# Patient Record
Sex: Male | Born: 1990 | Race: White | Hispanic: No | Marital: Married | Smoking: Current some day smoker
Health system: Southern US, Community
[De-identification: ages and names within clinical notes are randomized; demographics above are authoritative.]

---

## 2015-05-23 ENCOUNTER — Ambulatory Visit (INDEPENDENT_AMBULATORY_CARE_PROVIDER_SITE_OTHER): Payer: Managed Care, Other (non HMO) | Admitting: Family Medicine

## 2015-05-23 VITALS — BP 114/74 | HR 60 | Temp 98.7°F | Resp 14 | Ht 72.0 in | Wt 167.0 lb

## 2015-05-23 DIAGNOSIS — Z23 Encounter for immunization: Secondary | ICD-10-CM

## 2015-05-23 NOTE — Patient Instructions (Addendum)
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    Tdap Vaccine (Tetanus, Diphtheria, Pertussis): What You Need to Know 1. Why get vaccinated? Tetanus, diphtheria and pertussis can be very serious diseases, even for adolescents and adults. Tdap vaccine can protect [place] from these diseases. TETANUS (Lockjaw) causes painful muscle tightening and stiffness, usually all over the body.  It can lead to tightening of muscles in the head and neck so you can't open your mouth, swallow, or sometimes even breathe. Tetanus kills about 1 out of 5 people who are infected. DIPHTHERIA can cause a thick coating to form in the back of the throat.  It can lead to breathing problems, paralysis, heart failure, and death. PERTUSSIS (Whooping Cough) causes severe coughing spells, which can cause difficulty breathing, vomiting and disturbed sleep.  It can also lead to weight loss, incontinence, and rib fractures. Up to 2 in 100 adolescents and 5 in 100 adults with pertussis are hospitalized or have complications, which could include pneumonia or death. These diseases are caused by bacteria. Diphtheria and pertussis are spread from person to person through coughing or sneezing. Tetanus enters the body through cuts, scratches, or wounds. Before vaccines, the [place] saw as many as 200,000 cases a year of diphtheria and pertussis, and hundreds of cases of tetanus. Since vaccination began, tetanus and diphtheria have dropped by about 99% and pertussis by about 80%. 2. Tdap vaccine Tdap vaccine can protect adolescents and adults from tetanus, diphtheria, and pertussis. One dose of Tdap is routinely given at age 68 or 41. People who did not get Tdap at that age should get it as soon as possible. Tdap is especially important for health care professionals and anyone having close contact with a baby younger than 12 months. Pregnant women should get a dose of Tdap during every pregnancy, to protect  the newborn from pertussis. Infants are most at risk for severe, life-threatening complications from pertussis. A similar vaccine, called Td, protects from tetanus and diphtheria, but not pertussis. A Td booster should be given every 10 years. Tdap may be given as one of these boosters if you have not already gotten a dose. Tdap may also be given after a severe cut or burn to prevent tetanus infection. Your doctor can give you more information. Tdap may safely be given at the same time as other vaccines. 3. Some people should not get this vaccine  If you ever had a life-threatening allergic reaction after a dose of any tetanus, diphtheria, or pertussis containing vaccine, OR if you have a severe allergy to any part of this vaccine, you should not get Tdap. Tell your doctor if you have any severe allergies.  If you had a coma, or long or multiple seizures within 7 days after a childhood dose of DTP or DTaP, you should not get Tdap, unless a cause other than the vaccine was found. You can still get Td.  Talk to your doctor if you:  have epilepsy or another nervous system problem,  had severe pain or swelling after any vaccine containing diphtheria, tetanus or pertussis,  ever had Guillain-Barr Syndrome (GBS),  aren't feeling well on the day the shot is scheduled. 4. Risks of a vaccine reaction With any medicine, including vaccines, there is a chance of side effects. These are usually mild and go away on their own, but serious reactions are also possible. Brief fainting spells can follow a vaccination, leading to injuries from falling. Sitting or  lying down for about 15 minutes can help prevent these. Tell your doctor if you feel dizzy or light-headed, or have vision changes or ringing in the ears. Mild problems following Tdap (Did not interfere with activities)  Pain where the shot was given (about 3 in 4 adolescents or 2 in 3 adults)  Redness or swelling where the shot was given (about 1  person in 5)  Mild fever of at least 100.45F (up to about 1 in 25 adolescents or 1 in 100 adults)  Headache (about 3 or 4 people in 10)  Tiredness (about 1 person in 3 or 4)  Nausea, vomiting, diarrhea, stomach ache (up to 1 in 4 adolescents or 1 in 10 adults)  Chills, body aches, sore joints, rash, swollen glands (uncommon) Moderate problems following Tdap (Interfered with activities, but did not require medical attention)  Pain where the shot was given (about 1 in 5 adolescents or 1 in 100 adults)  Redness or swelling where the shot was given (up to about 1 in 16 adolescents or 1 in 25 adults)  Fever over 102F (about 1 in 100 adolescents or 1 in 250 adults)  Headache (about 3 in 20 adolescents or 1 in 10 adults)  Nausea, vomiting, diarrhea, stomach ache (up to 1 or 3 people in 100)  Swelling of the entire arm where the shot was given (up to about 3 in 100). Severe problems following Tdap (Unable to perform usual activities; required medical attention)  Swelling, severe pain, bleeding and redness in the arm where the shot was given (rare). A severe allergic reaction could occur after any vaccine (estimated less than 1 in a million doses). 5. What if there is a serious reaction? What should I look for?  Look for anything that concerns you, such as signs of a severe allergic reaction, very high fever, or behavior changes. Signs of a severe allergic reaction can include hives, swelling of the face and throat, difficulty breathing, a fast heartbeat, dizziness, and weakness. These would start a few minutes to a few hours after the vaccination. What should I do?  If you think it is a severe allergic reaction or other emergency that can't wait, call 9-1-1 or get the person to the nearest hospital. Otherwise, call your doctor.  Afterward, the reaction should be reported to the "Vaccine Adverse Event Reporting System" (VAERS). Your doctor might file this report, or you can do it  yourself through the VAERS web site at www.vaers.LAgents.no, or by calling 1-980 268 3133. VAERS is only for reporting reactions. They do not give medical advice.  6. The National Vaccine Injury Compensation Program The [employer] Vaccine Injury Compensation Program (VICP) is a federal program that was created to compensate people who may have been injured by certain vaccines. Persons who believe they may have been injured by a vaccine can learn about the program and about filing a claim by calling 1-720-206-4131 or visiting the VICP website at SpiritualWord.at. 7. How can I learn more?  Ask your doctor.  Call your local or state health department.  Contact the Centers for Disease Control and Prevention (CDC):  Call 832-680-3862 or visit CDC's website at PicCapture.uy. CDC Tdap Vaccine VIS (02/10/12) Document Released: 03/21/2012 Document Revised: 02/04/2014 Document Reviewed: 01/02/2014 ExitCare Patient Information 2015 [place], [place]. This information is not intended to replace advice given to you by your health care provider. Make sure you discuss any questions you have with your health care provider.

## 2015-05-23 NOTE — Progress Notes (Signed)
[facility] [address] [place] [address] [phone]  Date:  05/23/2015   Name:  Steve Huerta   DOB:  03-01-1991   MRN:  [id number]  PCP:  No primary care provider on file.    Chief Complaint: Immunizations   History of Present Illness:  Steve Huerta is a 24 y.o. very pleasant male patient who presents with the following:  He is a Ship broker at [employer].  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    He states he was given "1/2 doses" of immunizations as a child because he tended to have "strong reactions"- his mother called, and stated that he had leg swelling after his MMR vaccine. Throughout his childhood it seems that they had him on reduced doses of his shots.  He has not had an immunization in some years.  He never had an allergic reaction per se to an immunization He is otherwise healthy   There are no active problems to display for this patient.   No past medical history on file.  No past surgical history on file.  Social History  Substance Use Topics  . Smoking status: Never Smoker   . Smokeless tobacco: Not on file  . Alcohol Use: No    No family history on file.  Allergies no known allergies  Medication list has been reviewed and updated.  No current outpatient prescriptions on file prior to visit.   No current facility-administered medications on file prior to visit.    Review of Systems:  As per HPI- otherwise negative.   Physical Examination: Filed Vitals:   05/23/15 1410  BP: 114/74  Pulse: 60  Temp: 98.7 F (37.1 C)  Resp: 14   Filed Vitals:   05/23/15 1410  Height: 6' (1.829 m)  Weight: 167 lb (75.751 kg)   Body mass index is 22.64 kg/(m^2). Ideal Body Weight: Weight in (lb) to have BMI = 25: 183.9   GEN: WDWN, NAD, Non-toxic, Alert & Oriented x 3, looks well HEENT: Atraumatic, Normocephalic.  Ears and Nose: No external deformity. EXTR: No  clubbing/cyanosis/edema NEURO: Normal gait.  PSYCH: Normally interactive. Conversant. Not depressed or anxious appearing.  Calm demeanor.   Assessment and Plan: Immunization due - Plan: Tdap vaccine greater than or equal to 7yo IM  Given tdap today- after discussion Steve Huerta chose to have the full dose of tdap.   We did give it in the hip as he has an audition tomorrow and is concerned about arm soreness  Fax to [phone] which is [facility] immunization fax  Signed Lamar Blinks, MD

## 2016-06-16 ENCOUNTER — Encounter: Payer: Self-pay | Admitting: Urgent Care

## 2016-06-16 ENCOUNTER — Ambulatory Visit (INDEPENDENT_AMBULATORY_CARE_PROVIDER_SITE_OTHER): Payer: Managed Care, Other (non HMO) | Admitting: Urgent Care

## 2016-06-16 VITALS — BP 118/74 | HR 80 | Temp 98.6°F | Resp 17 | Ht 72.0 in | Wt 166.0 lb

## 2016-06-16 DIAGNOSIS — F909 Attention-deficit hyperactivity disorder, unspecified type: Secondary | ICD-10-CM | POA: Diagnosis not present

## 2016-06-16 DIAGNOSIS — F988 Other specified behavioral and emotional disorders with onset usually occurring in childhood and adolescence: Secondary | ICD-10-CM

## 2016-06-16 MED ORDER — AMPHETAMINE-DEXTROAMPHETAMINE 5 MG PO TABS: 5.0000 mg | ORAL_TABLET | Freq: Every day | ORAL | 0 refills | Status: DC

## 2016-06-16 NOTE — Patient Instructions (Addendum)
Please contact [facility] in 1 week if you have not yet heard from us.   Address: [address], [place], [place] [address]  Hours:  Open today  8AM-5PM  phone: [phone]    Amphetamine; Dextroamphetamine tablets What is this medicine? AMPHETAMINE; DEXTROAMPHETAMINE(am FET a meen; dex troe am FET a meen) is used to treat attention-deficit hyperactivity disorder (ADHD). It may also be used for narcolepsy. Federal law prohibits giving this medicine to any person other than the person for whom it was prescribed. Do not share this medicine with anyone else. This medicine may be used for other purposes; ask your health care provider or pharmacist if you have questions. What should I tell my health care provider before I take this medicine? They need to know if you have any of these conditions: -anxiety or panic attacks -circulation problems in fingers and toes -glaucoma -hardening or blockages of the arteries or heart blood vessels -heart disease or a heart defect -high blood pressure -history of a drug or alcohol abuse problem -history of stroke -kidney disease -liver disease -mental illness -seizures -suicidal thoughts, plans, or attempt; a previous suicide attempt by you or a family member -thyroid disease -Tourette's syndrome -an unusual or allergic reaction to dextroamphetamine, other amphetamines, other medicines, foods, dyes, or preservatives -pregnant or trying to get pregnant -breast-feeding How should I use this medicine? Take this medicine by mouth with a glass of water. Follow the directions on the prescription label. Take your doses at regular intervals. Do not take your medicine more often than directed. Do not suddenly stop your medicine. You must gradually reduce the dose or you may feel withdrawal effects. Ask your doctor or health care professional for advice. Talk to your pediatrician regarding the use of this medicine in children. Special care may  be needed. While this drug may be prescribed for children as young as 3 years for selected conditions, precautions do apply. Overdosage: If you think you have taken too much of this medicine contact a poison control center or emergency room at once. NOTE: This medicine is only for you. Do not share this medicine with others. What if I miss a dose? If you miss a dose, take it as soon as you can. If it is almost time for your next dose, take only that dose. Do not take double or extra doses. What may interact with this medicine? Do not take this medicine with any of the following medications: -MAOIS like Carbex, Eldepryl, Marplan, Nardil, and Parnate -other stimulant medicines for attention disorders, weight loss, or to stay awake This medicine may also interact with the following medications: -acetazolamide -ammonium chloride -antacids -ascorbic acid -atomoxetine -caffeine -certain medicines for blood pressure -certain medicines for depression, anxiety, or psychotic disturbances -certain medicines for seizures like carbamazepine, phenobarbital, phenytoin -certain medicines for stomach problems like cimetidine, famotidine, omeprazole, lansoprazole -cold or allergy medicines -glutamic acid -lithium -meperidine -methenamine; sodium acid phosphate -narcotic medicines for pain -norepinephrine -phenothiazines like chlorpromazine, mesoridazine, prochlorperazine, thioridazine -sodium acid phosphate -sodium bicarbonate This list may not describe all possible interactions. Give your health care provider a list of all the medicines, herbs, non-prescription drugs, or dietary supplements you use. Also tell them if you smoke, drink alcohol, or use illegal drugs. Some items may interact with your medicine. What should I watch for while using this medicine? Visit your doctor or health care professional for regular checks on your progress. This prescription requires that you follow special procedures  with your doctor  and pharmacy. You will need to have a new written prescription from your doctor every time you need a refill. This medicine may affect your concentration, or hide signs of tiredness. Until you know how this medicine affects you, do not drive, ride a bicycle, use machinery, or do anything that needs mental alertness. Tell your doctor or health care professional if this medicine loses its effects, or if you feel you need to take more than the prescribed amount. Do not change the dosage without talking to your doctor or health care professional. Decreased appetite is a common side effect when starting this medicine. Eating small, frequent meals or snacks can help. Talk to your doctor if you continue to have poor eating habits. Height and weight growth of a child taking this medicine will be monitored closely. Do not take this medicine close to bedtime. It may prevent you from sleeping. If you are going to need surgery, a MRI, CT scan, or other procedure, tell your doctor that you are taking this medicine. You may need to stop taking this medicine before the procedure. Tell your doctor or healthcare professional right away if you notice unexplained wounds on your fingers and toes while taking this medicine. You should also tell your healthcare provider if you experience numbness or pain, changes in the skin color, or sensitivity to temperature in your fingers or toes. What side effects may I notice from receiving this medicine? Side effects that you should report to your doctor or health care professional as soon as possible: -allergic reactions like skin rash, itching or hives, swelling of the face, lips, or tongue -changes in vision -chest pain or chest tightness -confusion, trouble speaking or understanding -fast, irregular heartbeat -fingers or toes feel numb, cool, painful -hallucination, loss of contact with reality -high blood pressure -males: prolonged or painful  erection -seizures -severe headaches -shortness of breath -suicidal thoughts or other mood changes -trouble walking, dizziness, loss of balance or coordination -uncontrollable head, mouth, neck, arm, or leg movements Side effects that usually do not require medical attention (report to your doctor or health care professional if they continue or are bothersome): -anxious -headache -loss of appetite -nausea, vomiting -trouble sleeping -weight loss This list may not describe all possible side effects. Call your doctor for medical advice about side effects. You may report side effects to FDA at 1-800-FDA-1088. Where should I keep my medicine? Keep out of the reach of children. This medicine can be abused. Keep your medicine in a safe place to protect it from theft. Do not share this medicine with anyone. Selling or giving away this medicine is dangerous and against the law. Store at room temperature between 15 and 30 degrees C (59 and 86 degrees F). Keep container tightly closed. Throw away any unused medicine after the expiration date. Dispose of properly. This medicine may cause accidental overdose and death if it is taken by other adults, children, or pets. Mix any unused medicine with a substance like cat litter or coffee grounds. Then throw the medicine away in a sealed container like a sealed bag or a coffee can with a lid. Do not use the medicine after the expiration date. NOTE: This sheet is a summary. It may not cover all possible information. If you have questions about this medicine, talk to your doctor, pharmacist, or health care provider.    2016, Elsevier/Gold Standard. (2014-07-24 18:44:41)     IF you received an x-ray today, you will receive an invoice from [facility].  Please contact [facility] at [phone] with questions or concerns regarding your invoice.   IF you received labwork today, you will receive an invoice from [facility]. Please contact [facility] at [phone] with questions or concerns regarding your invoice.   Our billing staff will not be able to assist you with questions regarding bills from these companies.  You will be contacted with the lab results as soon as they are available. The fastest way to get your results is to activate your My Chart account. Instructions are located on the last page of this paperwork. If you have not heard from [place] regarding the results in 2 weeks, please contact this office.

## 2016-06-16 NOTE — Progress Notes (Signed)
    MRN: [id number] DOB: 1990-12-07  Subjective:   Steve Huerta is a 25 y.o. male presenting for chief complaint of ADD medication (Reffered by Dr. Leonor LivHolt from [facility]. )  Patient is coming to us as recommended by Dr. Leonor LivHolt, wanted him to come in to see Dr. Merla Richesoolittle who is now retired. 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 He managed this through therapy and without medical therapy. He has considered taking medication for a long time but was hesitant due to side effects, potential for abusive. Denies depression, impulsivity, anxiety, irritability, insomnia, decreased appetite, chest pain, heart racing.  Steve Huerta currently has no medications in their medication list. Also has No Known Allergies.  Steve Huerta  has no past medical history on file. Also  has no past surgical history on file.  Objective:   Vitals: BP 118/74 (BP Location: Left Arm, Patient Position: Sitting, Cuff Size: Normal)   Pulse 80   Temp 98.6 F (37 C) (Oral)   Resp 17   Ht 6' (1.829 m)   Wt 166 lb (75.3 kg)   SpO2 97%   BMI 22.51 kg/m   Physical Exam  Constitutional: He is oriented to person, place, and time. He appears well-developed and well-nourished.  Cardiovascular: Normal rate.   Pulmonary/Chest: Effort normal.  Neurological: He is alert and oriented to person, place, and time.  Psychiatric: His mood appears not anxious. His speech is not rapid and/or pressured. He is not agitated. He does not exhibit a depressed mood.   Assessment and Plan :   1. ADD (attention deficit disorder) - Referred to [facility]. Patient wanted to start trial with 5mg  Adderall. F/u with [facility].  Wallis BambergMario Adarian Bur, PA-C [facility] [phone] 06/16/2016 3:21 PM

## 2016-07-28 ENCOUNTER — Ambulatory Visit (INDEPENDENT_AMBULATORY_CARE_PROVIDER_SITE_OTHER): Payer: Managed Care, Other (non HMO) | Admitting: Family Medicine

## 2016-07-28 DIAGNOSIS — F988 Other specified behavioral and emotional disorders with onset usually occurring in childhood and adolescence: Secondary | ICD-10-CM | POA: Diagnosis not present

## 2016-07-28 MED ORDER — AMPHETAMINE-DEXTROAMPHETAMINE 5 MG PO TABS: 5.0000 mg | ORAL_TABLET | Freq: Every day | ORAL | 0 refills | Status: DC

## 2016-07-28 NOTE — Patient Instructions (Addendum)
  Great to meet you!  Be sure to try and get established at [facility].     IF you received an x-ray today, you will receive an invoice from [facility]. Please contact [facility] at [phone] with questions or concerns regarding your invoice.   IF you received labwork today, you will receive an invoice from [facility]. Please contact [facility] at [phone] with questions or concerns regarding your invoice.   Our billing staff will not be able to assist you with questions regarding bills from these companies.  You will be contacted with the lab results as soon as they are available. The fastest way to get your results is to activate your My Chart account. Instructions are located on the last page of this paperwork. If you have not heard from us regarding the results in 2 weeks, please contact this office.

## 2016-07-28 NOTE — Progress Notes (Signed)
   HPI  Patient presents today here for follow-up of ADHD.  Patient has a long-standing history of ADHD diagnosed in junior high. He was seeing a psychiatrist as a child and was never medicated. 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  He was started on 5 mg of Adderall last month in our office.  He feels like the medication is helping, it is not causing any side effects, and also is not making him feel strange in anyway.  He is very cautious about the medication, understanding its abusive and addictive properties.  He denies any anxiety, difficulty sleeping, decrease in appetite or headaches associated with the medication.    PMH: Smoking status noted Denies drug use.  ROS: Per HPI  Objective: BP 108/62 (BP Location: Right Arm, Patient Position: Sitting, Cuff Size: Normal)   Pulse 81   Temp 98.9 F (37.2 C)   Resp 16   Ht 5\' 11"  (1.803 m)   Wt 161 lb (73 kg)   SpO2 99%   BMI 22.45 kg/m  Gen: NAD, alert, cooperative with exam HEENT: NCAT CV: RRR, good S1/S2, no murmur Resp: CTABL, no wheezes, non-labored Ext: No edema, warm Neuro: Alert and oriented, No gross deficits  Assessment and plan:  # ADHD Improving on Adderall Refilled 5 mg Adderall, #30 Encouraged him to make his appointment for [facility], he has hesitated due to not wanting to change the medication while he's 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 Recommended calling for an appointment which will likely be more than one month away and returning in one month for refill of medication.    Meds ordered this encounter  Medications  . amphetamine-dextroamphetamine (ADDERALL) 5 MG tablet    Sig: Take 1 tablet (5 mg total) by mouth daily.    Dispense:  30 tablet    Refill:  0    Kevin FentonSamuel Bradshaw, MD 2:42 PM

## 2016-09-02 ENCOUNTER — Ambulatory Visit: Payer: Managed Care, Other (non HMO)

## 2016-09-02 ENCOUNTER — Ambulatory Visit (INDEPENDENT_AMBULATORY_CARE_PROVIDER_SITE_OTHER): Payer: Managed Care, Other (non HMO) | Admitting: Family Medicine

## 2016-09-02 VITALS — BP 110/66 | HR 72 | Temp 98.3°F | Resp 18 | Ht 71.0 in | Wt 159.0 lb

## 2016-09-02 DIAGNOSIS — Z021 Encounter for pre-employment examination: Secondary | ICD-10-CM | POA: Diagnosis not present

## 2016-09-02 DIAGNOSIS — Z0289 Encounter for other administrative examinations: Secondary | ICD-10-CM

## 2016-09-02 NOTE — Patient Instructions (Signed)
     IF you received an x-ray today, you will receive an invoice from [facility]. Please contact [facility] at [phone] with questions or concerns regarding your invoice.   IF you received labwork today, you will receive an invoice from [facility]. Please contact [facility] at [phone] with questions or concerns regarding your invoice.   Our billing staff will not be able to assist you with questions regarding bills from these companies.  You will be contacted with the lab results as soon as they are available. The fastest way to get your results is to activate your My Chart account. Instructions are located on the last page of this paperwork. If you have not heard from us regarding the results in 2 weeks, please contact this office.      

## 2016-09-02 NOTE — Progress Notes (Signed)
Tuberculosis Risk Questionnaire  1. No Were you born outside the [place] in one of the following parts of the world: [place], [place], [place], [place] or [place]?    2. No Have you traveled outside the [place] and lived for more than one month in one of the following parts of the world: [place], [place], [place], [place] or [place]?    3. No Do you have a compromised immune system such as from any of the following conditions:HIV/AIDS, organ or bone marrow transplantation, diabetes, immunosuppressive medicines (e.g. Prednisone, Remicaide), leukemia, lymphoma, cancer of the head or neck, gastrectomy or jejunal bypass, end-stage renal disease (on dialysis), or silicosis?     4. No Have you ever or do you plan on working in: a residential care center, a health care facility, a jail or prison or homeless shelter?    5. No Have you ever: injected illegal drugs, used crack cocaine, lived in a homeless shelter  or been in jail or prison?     6. No Have you ever been exposed to anyone with infectious tuberculosis?     Tuberculosis Symptom Questionnaire  Do you currently have any of the following symptoms?  1. No Unexplained cough lasting more than 3 weeks?   2. No Unexplained fever lasting more than 3 weeks.   3. No Night Sweats (sweating that leaves the bedclothes and sheets wet)     4. No Shortness of Breath   5. No Chest Pain  No 6. No Unintentional weight loss    7. No Unexplained fatigue (very tired for no reason)  Subjective:     Patient ID: Steve Huerta, male   DOB: Aug 27, 1991, 25 y.o.   MRN: [id number]  HPI 25 year old male presents for a physical exam for employment. [facility]. Current on all immunizations. Declines influenza.  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 He is also self-employed as a Firefighterprivate music instructor. He denies any other medical or mental health problems or concerns.  Social History   Social History  . Marital status: Married    Spouse name: N/A  . Number of children: N/A  . Years of education: N/A   Occupational History  . Not on file.   Social History Main Topics  . Smoking status: Never Smoker  . Smokeless tobacco: Never Used  . Alcohol use No  . Drug use: No  . Sexual activity: Not on file   Other Topics Concern  . Not on file   Social History Narrative  . No narrative on file    History reviewed. No pertinent family history. Review of Systems  See HPI Objective:   Physical Exam  Constitutional: He is oriented to person, place, and time. He appears well-developed and well-nourished.  HENT:  Head: Normocephalic and atraumatic.  Right Ear: External ear normal.  Left Ear: External ear normal.  Nose: Nose normal.  Mouth/Throat: Oropharynx is clear and moist.  Eyes: Conjunctivae and EOM are normal. Pupils are equal, round, and reactive to light.  Neck: Normal range of motion. Neck supple.  Cardiovascular: Normal rate, regular rhythm, normal heart sounds and intact distal pulses.   Pulmonary/Chest: Effort normal and breath sounds normal.  Abdominal: Soft. Bowel sounds are normal.  Musculoskeletal: Normal range of motion.  Lymphadenopathy:  He has no cervical adenopathy.  Neurological: He is alert and oriented to person, place, and time.  Skin: Skin is warm and dry.  Psychiatric: He has a normal mood and affect. His behavior is normal. Judgment and thought content normal.   Vitals:   09/02/16 1553  BP: 110/66  Pulse: 72  Resp: 18  Temp: 98.3 F (36.8 C)      Assessment:       Plan:  1. Encounter for physical examination related to employment -Work-related physical form completed  -TB Free Letter provided to patient  Follow-up as needed.  Godfrey PickKimberly S. Tiburcio PeaHarris, MSN,  FNP-C [facility]

## 2016-11-10 ENCOUNTER — Ambulatory Visit (INDEPENDENT_AMBULATORY_CARE_PROVIDER_SITE_OTHER): Payer: Managed Care, Other (non HMO) | Admitting: Emergency Medicine

## 2016-11-10 DIAGNOSIS — F988 Other specified behavioral and emotional disorders with onset usually occurring in childhood and adolescence: Secondary | ICD-10-CM | POA: Diagnosis not present

## 2016-11-10 MED ORDER — AMPHETAMINE-DEXTROAMPHETAMINE 5 MG PO TABS: 5.0000 mg | ORAL_TABLET | Freq: Every day | ORAL | 0 refills | Status: AC

## 2016-11-10 NOTE — Patient Instructions (Addendum)
   IF you received an x-ray today, you will receive an invoice from [facility]. Please contact [facility] at [phone] with questions or concerns regarding your invoice.   IF you received labwork today, you will receive an invoice from [facility]. Please contact [facility] at [phone] with questions or concerns regarding your invoice.   Our billing staff will not be able to assist you with questions regarding bills from these companies.  You will be contacted with the lab results as soon as they are available. The fastest way to get your results is to activate your My Chart account. Instructions are located on the last page of this paperwork. If you have not heard from us regarding the results in 2 weeks, please contact this office.      Attention Deficit Hyperactivity Disorder, Pediatric Attention deficit hyperactivity disorder (ADHD) is a condition that can make it hard for a child to pay attention and concentrate or to control his or her behavior. The child may also have a lot of energy. ADHD is a disorder of the brain (neurodevelopmental disorder), and symptoms are typically first seen in early childhood. It is a common reason for behavioral and academic problems in school. There are three main types of ADHD:  Inattentive. With this type, children have difficulty paying attention.  Hyperactive-impulsive. With this type, children have a lot of energy and have difficulty controlling their behavior.  Combination. This type involves having symptoms of both of the other types. ADHD is a lifelong condition. If it is not treated, the disorder can affect a child's future academic achievement, employment, and relationships. What are the causes? The exact cause of this condition is not known. What increases the risk? This condition is more likely to develop in:  Children who have a first-degree relative, such as a parent or brother or sister, with the  condition.  Children who had a low birth weight.  Children whose mothers had problems during pregnancy or used alcohol or tobacco during pregnancy.  Children who have had a brain infection or a head injury.  Children who have been exposed to lead. What are the signs or symptoms? Symptoms of this condition depend on the type of ADHD. Symptoms are listed here for each type: Inattentive   Problems with organization.  Difficulty staying focused.  Problems completing assignments at school.  Often making simple mistakes.  Problems sustaining mental effort.  Not listening to instructions.  Losing things often.  Forgetting things often.  Being easily distracted. Hyperactive-impulsive   Fidgeting often.  Difficulty sitting still in one's seat.  Talking a lot.  Talking out of turn.  Interrupting others.  Difficulty relaxing or doing quiet activities.  High energy levels and constant movement.  Difficulty waiting.  Always "on the go." Combination   Having symptoms of both of the other types. Children with ADHD may feel frustrated with themselves and may find school to be particularly discouraging. They often perform below their abilities in school. As children get older, the excess movement can lessen, but the problems with paying attention and staying organized often continue. Most children do not outgrow ADHD, but with good treatment, they can learn to cope with the symptoms. How is this diagnosed? This condition is diagnosed based on a child's symptoms and academic history. The child's health care provider will do a complete assessment. As part of the assessment, the health care provider will ask the child questions and will ask the parents and teachers for their observations of   the child. The health care provider looks for specific symptoms of ADHD. Diagnosis will include:  Ruling out other reasons for the child's behavior.  Reviewing behavior rating scales that  have been filled out about the child by people who deal with the child on a daily basis. A diagnosis is made only after all information from multiple people has been considered. How is this treated? Treatment for this condition may include:  Behavior therapy.  Medicines to decrease impulsivity and hyperactivity and to increase attention. Behavior therapy is preferred for children younger than 6 years old. The combination of medicine and behavior therapy is most effective for children older than 6 years of age.  Tutoring or extra support at school.  Techniques for parents to use at home to help manage their child's symptoms and behavior. Follow these instructions at home: Eating and drinking   Offer your child a well-balanced diet. Breakfast that includes a balance of whole grains, protein, and fruits or vegetables is especially important for school performance.  If your child has trouble with hyperactivity, have your child avoid drinks that contain caffeine. These include:  Soft drinks.  Coffee.  Tea.  If your child is older and finds that caffeinated drinks help to improve his or her attention, talk with your child's health care provider about what amount of caffeine intake is a safe for your child. Lifestyle    Make sure your child gets a full night of sleep and regular daily exercise.  Help manage your child's behavior by following the techniques learned in therapy. These may include:  Looking for good behavior and rewarding it.  Making rules for behavior that your child can understand and follow.  Giving clear instructions.  Responding consistently to your child's challenging behaviors.  Setting realistic goals.  Looking for activities that can lead to success and self-esteem.  Making time for pleasant activities with your child.  Giving lots of affection.  Help your child learn to be organized. Some ways to do this include:  Keeping daily schedules the same.  Have a regular wake-up time and bedtime for your child. Schedule all activities, including time for homework and time for play. Post the schedule in a place where your child will see it. Mark schedule changes in advance.  Having a regular place for your child to store items such as clothing, backpacks, and school supplies.  Encouraging your child to write down school assignments and to bring home needed books. Work with your child's teachers for assistance in organizing school work. General instructions   Learn as much as you can about ADHD. This will improve your ability to help your child and to make sure he or she gets the support needed. It will also help you educate your child's teachers and instructors if they do not feel that they have adequate knowledge or experience in these areas.  Work with your child's teachers to make sure your child gets the support and extra help that is needed. This may include:  Tutoring.  Teacher cues to help your child remain on task.  Seating changes so your child is working at a desk that is free from distractions.  Give over-the-counter and prescription medicines only as told by your child's health care provider.  Keep all follow-up visits as told by your health care provider. This is important. Contact a health care provider if:  Your child has repeated muscle twitches (tics), coughs, or speech outbursts.  Your child has sleep problems.  Your child   has a marked loss of appetite.  Your child develops depression.  Your child has new or worsening behavioral problems.  Your child has dizziness.  Your child has a racing heart.  Your child has stomach pains.  Your child develops headaches. Get help right away if:  Your child talks about or threatens suicide.  You are worried that your child is having a bad reaction to a medicine that he or she is taking for ADHD. This information is not intended to replace advice given to you by your  health care provider. Make sure you discuss any questions you have with your health care provider. Document Released: 09/10/2002 Document Revised: 05/19/2016 Document Reviewed: 04/15/2016 Elsevier Interactive Patient Education  2017 Elsevier Inc.  

## 2016-11-10 NOTE — Progress Notes (Signed)
Steve Huerta 26 y.o.   Chief Complaint  Patient presents with  . Medication Refill    ADDERALL     HISTORY OF PRESENT ILLNESS: This is a 26 y.o. male here for medication refill; has appointment to see specialist next month; just started on Adderall several months ago.  HPI   Prior to Admission medications   Medication Sig Start Date End Date Taking? Authorizing Provider  amphetamine-dextroamphetamine (ADDERALL) 5 MG tablet Take 1 tablet (5 mg total) by mouth daily. 11/10/16  Yes [facility], MD    No Known Allergies  There are no active problems to display for this patient.   No past medical history on file.  No past surgical history on file.  Social History   Social History  . Marital status: Married    Spouse name: N/A  . Number of children: N/A  . Years of education: N/A   Occupational History  . Not on file.   Social History Main Topics  . Smoking status: Never Smoker  . Smokeless tobacco: Never Used  . Alcohol use No  . Drug use: No  . Sexual activity: No   Other Topics Concern  . Not on file   Social History Narrative  . No narrative on file    No family history on file.   Review of Systems  Constitutional: Negative.   HENT: Negative.   Eyes: Negative.   Respiratory: Negative.   Cardiovascular: Negative.   Gastrointestinal: Negative.   Genitourinary: Negative.   Musculoskeletal: Negative.   Skin: Negative.   Neurological: Negative.   Endo/Heme/Allergies: Negative.   All other systems reviewed and are negative.  Vitals:   11/10/16 1355  BP: 122/72  Pulse: 72  Resp: 17  Temp: 98.5 F (36.9 C)     Physical Exam  Constitutional: He is oriented to person, place, and time. He appears well-developed and well-nourished.  HENT:  Head: Normocephalic and atraumatic.  Nose: Nose normal.  Mouth/Throat: Oropharynx is clear and moist.  Eyes: Conjunctivae and EOM are normal. Pupils are equal, round, and reactive to light.  Neck:  Normal range of motion. Neck supple. No JVD present.  Cardiovascular: Normal rate, regular rhythm and normal heart sounds.   Pulmonary/Chest: Effort normal and breath sounds normal.  Abdominal: Soft. There is no tenderness.  Musculoskeletal: Normal range of motion.  Lymphadenopathy:    He has no cervical adenopathy.  Neurological: He is alert and oriented to person, place, and time. No sensory deficit. He exhibits normal muscle tone.  Skin: Skin is warm and dry.  Vitals reviewed.    ASSESSMENT & PLAN: Steve Huerta was seen today for medication refill.  Diagnoses and all orders for this visit:  Attention deficit disorder, unspecified hyperactivity presence -     amphetamine-dextroamphetamine (ADDERALL) 5 MG tablet; Take 1 tablet (5 mg total) by mouth daily.    Patient Instructions       IF you received an x-ray today, you will receive an invoice from [facility]. Please contact [facility] at [phone] with questions or concerns regarding your invoice.   IF you received labwork today, you will receive an invoice from [facility]. Please contact [facility] at [phone] with questions or concerns regarding your invoice.   Our billing staff will not be able to assist you with questions regarding bills from these companies.  You will be contacted with the lab results as soon as they are available. The fastest way to get your results is to activate your My Chart  account. Instructions are located on the last page of this paperwork. If you have not heard from us regarding the results in 2 weeks, please contact this office.      Attention Deficit Hyperactivity Disorder, Pediatric Attention deficit hyperactivity disorder (ADHD) is a condition that can make it hard for a child to pay attention and concentrate or to control his or her behavior. The child may also have a lot of energy. ADHD is a disorder of the brain (neurodevelopmental disorder), and symptoms are typically  first seen in early childhood. It is a common reason for behavioral and academic problems in school. There are three main types of ADHD:  Inattentive. With this type, children have difficulty paying attention.  Hyperactive-impulsive. With this type, children have a lot of energy and have difficulty controlling their behavior.  Combination. This type involves having symptoms of both of the other types. ADHD is a lifelong condition. If it is not treated, the disorder can affect a child's future academic achievement, employment, and relationships. What are the causes? The exact cause of this condition is not known. What increases the risk? This condition is more likely to develop in:  Children who have a first-degree relative, such as a parent or brother or sister, with the condition.  Children who had a low birth weight.  Children whose mothers had problems during pregnancy or used alcohol or tobacco during pregnancy.  Children who have had a brain infection or a head injury.  Children who have been exposed to lead. What are the signs or symptoms? Symptoms of this condition depend on the type of ADHD. Symptoms are listed here for each type: Inattentive  Problems with organization.  Difficulty staying focused.  Problems completing assignments at school.  Often making simple mistakes.  Problems sustaining mental effort.  Not listening to instructions.  Losing things often.  Forgetting things often.  Being easily distracted. Hyperactive-impulsive  Fidgeting often.  Difficulty sitting still in one's seat.  Talking a lot.  Talking out of turn.  Interrupting others.  Difficulty relaxing or doing quiet activities.  High energy levels and constant movement.  Difficulty waiting.  Always "on the go." Combination  Having symptoms of both of the other types. Children with ADHD may feel frustrated with themselves and may find school to be particularly discouraging.  They often perform below their abilities in school. As children get older, the excess movement can lessen, but the problems with paying attention and staying organized often continue. Most children do not outgrow ADHD, but with good treatment, they can learn to cope with the symptoms. How is this diagnosed? This condition is diagnosed based on a child's symptoms and academic history. The child's health care provider will do a complete assessment. As part of the assessment, the health care provider will ask the child questions and will ask the parents and teachers for their observations of the child. The health care provider looks for specific symptoms of ADHD. Diagnosis will include:  Ruling out other reasons for the child's behavior.  Reviewing behavior rating scales that have been filled out about the child by people who deal with the child on a daily basis. A diagnosis is made only after all information from multiple people has been considered. How is this treated? Treatment for this condition may include:  Behavior therapy.  Medicines to decrease impulsivity and hyperactivity and to increase attention. Behavior therapy is preferred for children younger than 26 years old. The combination of medicine and behavior therapy is  most effective for children older than 34 years of age.  Tutoring or extra support at school.  Techniques for parents to use at home to help manage their child's symptoms and behavior. Follow these instructions at home: Eating and drinking  Offer your child a well-balanced diet. Breakfast that includes a balance of whole grains, protein, and fruits or vegetables is especially important for school performance.  If your child has trouble with hyperactivity, have your child avoid drinks that contain caffeine. These include:  Soft drinks.  Coffee.  Tea.  If your child is older and finds that caffeinated drinks help to improve his or her attention, talk with your  child's health care provider about what amount of caffeine intake is a safe for your child. Lifestyle  Make sure your child gets a full night of sleep and regular daily exercise.  Help manage your child's behavior by following the techniques learned in therapy. These may include:  Looking for good behavior and rewarding it.  Making rules for behavior that your child can understand and follow.  Giving clear instructions.  Responding consistently to your child's challenging behaviors.  Setting realistic goals.  Looking for activities that can lead to success and self-esteem.  Making time for pleasant activities with your child.  Giving lots of affection.  Help your child learn to be organized. Some ways to do this include:  Keeping daily schedules the same. Have a regular wake-up time and bedtime for your child. Schedule all activities, including time for homework and time for play. Post the schedule in a place where your child will see it. Mark schedule changes in advance.  Having a regular place for your child to store items such as clothing, backpacks, and school supplies.  Encouraging your child to write down school assignments and to bring home needed books. Work with your child's teachers for assistance in organizing school work. General instructions  Learn as much as you can about ADHD. This will improve your ability to help your child and to make sure he or she gets the support needed. It will also help you educate your child's teachers and instructors if they do not feel that they have adequate knowledge or experience in these areas.  Work with your child's teachers to make sure your child gets the support and extra help that is needed. This may include:  Tutoring.  Teacher cues to help your child remain on task.  Seating changes so your child is working at a desk that is free from distractions.  Give over-the-counter and prescription medicines only as told by your  child's health care provider.  Keep all follow-up visits as told by your health care provider. This is important. Contact a health care provider if:  Your child has repeated muscle twitches (tics), coughs, or speech outbursts.  Your child has sleep problems.  Your child has a marked loss of appetite.  Your child develops depression.  Your child has new or worsening behavioral problems.  Your child has dizziness.  Your child has a racing heart.  Your child has stomach pains.  Your child develops headaches. Get help right away if:  Your child talks about or threatens suicide.  You are worried that your child is having a bad reaction to a medicine that he or she is taking for ADHD. This information is not intended to replace advice given to you by your health care provider. Make sure you discuss any questions you have with your health care provider. Document  Released: 09/10/2002 Document Revised: 05/19/2016 Document Reviewed: 04/15/2016 Elsevier Interactive Patient Education  2017 Elsevier Inc.      Edwina Barth, MD [facility]

## 2017-05-02 ENCOUNTER — Telehealth: Payer: Self-pay | Admitting: Emergency Medicine

## 2017-05-02 NOTE — Telephone Encounter (Signed)
Pt dropped off  Health cert form to be completed from his previous pe done in November 2017     Best phone for pt is [phone]

## 2017-05-04 NOTE — Telephone Encounter (Signed)
Dr Alvy BimlerSagardia, has this form been completed? Please advise, thank you/ S.Domanic Matusek,CMA

## 2017-05-05 NOTE — Telephone Encounter (Signed)
Not seen by me last November. Know nothing about this form.

## 2017-05-09 ENCOUNTER — Telehealth: Payer: Self-pay | Admitting: *Deleted

## 2017-05-09 NOTE — Telephone Encounter (Signed)
Called patient no voice mail set up, unable to leave message to pick up health form.

## 2019-04-17 ENCOUNTER — Telehealth: Payer: Self-pay | Admitting: General Practice

## 2019-04-17 DIAGNOSIS — Z20822 Contact with and (suspected) exposure to covid-19: Secondary | ICD-10-CM

## 2019-04-17 NOTE — Telephone Encounter (Signed)
COVID-19 Testing Request:  Office name - [facility] Requesting Provider - elizabeth dewey/caller sabrina Contact number - [phone] Fax number - [phone] Reason for request - Exposure

## 2019-04-17 NOTE — Telephone Encounter (Signed)
Contacted pt and due to [facility] being full today, pt was scheduled for 04/18/19 at the [facility] site for 9:15. Pt is aware to wear a mask and to remain in his car. Pt understood and had no additional questions at this time. Nothing further is needed  Order was placed

## 2019-04-18 ENCOUNTER — Other Ambulatory Visit: Payer: Self-pay

## 2019-04-18 DIAGNOSIS — Z20822 Contact with and (suspected) exposure to covid-19: Secondary | ICD-10-CM

## 2019-04-22 LAB — NOVEL CORONAVIRUS, NAA: SARS-CoV-2, NAA: NOT DETECTED

## 2019-10-02 ENCOUNTER — Ambulatory Visit: Payer: BC Managed Care – PPO | Attending: Internal Medicine

## 2019-10-02 DIAGNOSIS — Z20822 Contact with and (suspected) exposure to covid-19: Secondary | ICD-10-CM

## 2019-10-02 DIAGNOSIS — Z20828 Contact with and (suspected) exposure to other viral communicable diseases: Secondary | ICD-10-CM | POA: Insufficient documentation

## 2019-10-04 LAB — NOVEL CORONAVIRUS, NAA: SARS-CoV-2, NAA: NOT DETECTED

## 2020-01-03 ENCOUNTER — Ambulatory Visit: Payer: Self-pay | Attending: Internal Medicine

## 2020-01-03 DIAGNOSIS — Z23 Encounter for immunization: Secondary | ICD-10-CM

## 2020-01-03 NOTE — Progress Notes (Signed)
   Covid-19 Vaccination Clinic  Name:  Steve Huerta    MRN: [id number] DOB: 05-02-1991  01/03/2020  [name] was observed post Covid-19 immunization for 15 minutes without incident. He was provided with Vaccine Information Sheet and instruction to access the V-Safe system.   [name] was instructed to call 911 with any severe reactions post vaccine: Marland Kitchen Difficulty breathing  . Swelling of face and throat  . A fast heartbeat  . A bad rash all over body  . Dizziness and weakness   Immunizations Administered    Name Date Dose VIS Date Route   Pfizer COVID-19 Vaccine 01/03/2020 10:26 AM 0.3 mL 09/14/2019 Intramuscular   Manufacturer: [employer], [employer]   Lot: [id number]   NDC: [address]-8

## 2020-01-28 ENCOUNTER — Ambulatory Visit: Payer: Self-pay | Attending: Internal Medicine

## 2020-01-28 DIAGNOSIS — Z23 Encounter for immunization: Secondary | ICD-10-CM

## 2020-01-28 NOTE — Progress Notes (Signed)
   Covid-19 Vaccination Clinic  Name:  Steve Huerta    MRN: [id number] DOB: [birth year]  01/28/2020  [name] was observed post Covid-19 immunization for 15 minutes without incident. He was provided with Vaccine Information Sheet and instruction to access the V-Safe system.   [name] was instructed to call 911 with any severe reactions post vaccine: Marland Kitchen Difficulty breathing  . Swelling of face and throat  . A fast heartbeat  . A bad rash all over body  . Dizziness and weakness   Immunizations Administered    Name Date Dose VIS Date Route   Pfizer COVID-19 Vaccine 01/28/2020  9:31 AM 0.3 mL 11/28/2018 Intramuscular   Manufacturer: [employer], [employer]   Lot: [id number]   NDC: [address]-3

## 2021-05-13 ENCOUNTER — Encounter (HOSPITAL_BASED_OUTPATIENT_CLINIC_OR_DEPARTMENT_OTHER): Payer: Self-pay | Admitting: Emergency Medicine

## 2021-05-13 ENCOUNTER — Other Ambulatory Visit: Payer: Self-pay

## 2021-05-13 ENCOUNTER — Emergency Department (HOSPITAL_BASED_OUTPATIENT_CLINIC_OR_DEPARTMENT_OTHER)
Admission: EM | Admit: 2021-05-13 | Discharge: 2021-05-14 | Disposition: A | Discharge status: Home / Self Care | Payer: Self-pay | Attending: Emergency Medicine | Admitting: Emergency Medicine

## 2021-05-13 ENCOUNTER — Emergency Department (HOSPITAL_BASED_OUTPATIENT_CLINIC_OR_DEPARTMENT_OTHER): Payer: Self-pay | Admitting: Radiology

## 2021-05-13 DIAGNOSIS — R091 Pleurisy: Secondary | ICD-10-CM | POA: Insufficient documentation

## 2021-05-13 DIAGNOSIS — Z20822 Contact with and (suspected) exposure to covid-19: Secondary | ICD-10-CM | POA: Insufficient documentation

## 2021-05-13 DIAGNOSIS — F17290 Nicotine dependence, other tobacco product, uncomplicated: Secondary | ICD-10-CM | POA: Insufficient documentation

## 2021-05-13 LAB — BASIC METABOLIC PANEL
Anion gap: 8 (ref 5–15)
BUN: 12 mg/dL (ref 6–20)
CO2: 28 mmol/L (ref 22–32)
Calcium: 9.2 mg/dL (ref 8.9–10.3)
Chloride: 106 mmol/L (ref 98–111)
Creatinine, Ser: 0.76 mg/dL (ref 0.61–1.24)
GFR, Estimated: >60 mL/min (ref >60)
Glucose, Bld: 79 mg/dL (ref 70–99)
Potassium: 3.7 mmol/L (ref 3.5–5.1)
Sodium: 142 mmol/L (ref 135–145)

## 2021-05-13 LAB — CBC
HCT: 43.4 % (ref 39.0–52.0)
Hemoglobin: 15 g/dL (ref 13.0–17.0)
MCH: 33.9 pg (ref 26.0–34.0)
MCHC: 34.6 g/dL (ref 30.0–36.0)
MCV: 98.2 fL (ref 80.0–100.0)
Platelets: 240 10*3/uL (ref 150–400)
RBC: 4.42 MIL/uL (ref 4.22–5.81)
RDW: 13.1 % (ref 11.5–15.5)
WBC: 5.2 10*3/uL (ref 4.0–10.5)
nRBC: 0 % (ref 0.0–0.2)

## 2021-05-13 LAB — RESP PANEL BY RT-PCR (FLU A&B, COVID) ARPGX2
Influenza A by PCR: NEGATIVE
Influenza B by PCR: NEGATIVE
SARS Coronavirus 2 by RT PCR: NEGATIVE

## 2021-05-13 LAB — TROPONIN I (HIGH SENSITIVITY)
Troponin I (High Sensitivity): <2 ng/L (ref <18)
Troponin I (High Sensitivity): <2 ng/L (ref <18)

## 2021-05-13 LAB — D-DIMER, QUANTITATIVE: D-Dimer, Quant: <0.27 ug/mL-FEU (ref 0.00–0.50)

## 2021-05-13 MED ORDER — NAPROXEN 500 MG PO TABS: 500.0000 mg | ORAL_TABLET | Freq: Two times a day (BID) | ORAL | 0 refills | Status: AC

## 2021-05-13 MED ORDER — KETOROLAC TROMETHAMINE 30 MG/ML IJ SOLN
30.0000 mg | Freq: Once | INTRAMUSCULAR | Status: AC
  Administered 2021-05-13: 30 mg via INTRAVENOUS
  Filled 2021-05-13: qty 1

## 2021-05-13 MED ORDER — TRAMADOL HCL 50 MG PO TABS: ORAL_TABLET | ORAL | 0 refills | Status: AC

## 2021-05-13 NOTE — Discharge Instructions (Addendum)
1.  At this time I suspect you have pleurisy.  Review information in your discharge instructions.  Currently there is no indication that you have a blood clot to the lungs or a heart attack.  Chest x-ray does not show any signs of pneumonia and your COVID test is negative. 2.  Take naproxen as prescribed twice daily for the next 5 to 7 days.  You may also take extra strength Tylenol with naproxen if needed.  You have been prescribed tramadol to take 1 to 2 tablets every 6 hours if additional pain control is required. 3.  Schedule a follow-up appointment with a family doctor for recheck.  You can use the referral number in your discharge instructions to help find a physician taking new patients or use the resource guide included in your discharge instructions. 4.  Return to the emergency department if you develop shortness of breath, fevers, worsening pain, cough, lightheadedness, passing out or other concerning symptoms.

## 2021-05-13 NOTE — ED Triage Notes (Addendum)
L side chest pain since 11am. States laying on his R side makes it feel better. Sent from [facility].

## 2021-05-13 NOTE — ED Provider Notes (Signed)
[facility] EMERGENCY DEPT Provider Note   CSN: [id number] Arrival date & time: 05/13/21  1431     History Chief Complaint  Patient presents with   Chest Pain    Steve Huerta is a 30 y.o. male.  HPI Patient reports that he got a sudden pain in his left anterior chest.  It happened at about 11:00.  The pain was really sharp but also deep feeling, through to his back.  He reports it was really severe at onset.  He had a lot of pain with trying to take a deep breath.  He reports that with any kind of movements or walking it was much worse.  Patient denies any recent fever or cough.  He has not been sick.  No lower extremity swelling.  Patient reports that his wife had COVID about 2 weeks ago but he consistently tested negative and did not develop any symptoms.  Has never had similar pain.    History reviewed. No pertinent past medical history.  Patient Active Problem List   Diagnosis Date Noted   Attention deficit disorder 11/10/2016    History reviewed. No pertinent surgical history.     No family history on file.  Social History   Tobacco Use   Smoking status: Some Days    Types: Cigars   Smokeless tobacco: Never  Substance Use Topics   Alcohol use: Yes    Comment: daily   Drug use: No    Home Medications Prior to Admission medications   Medication Sig Start Date End Date Taking? Authorizing Provider  naproxen (NAPROSYN) 500 MG tablet Take 1 tablet (500 mg total) by mouth 2 (two) times daily. 05/13/21  Yes Arby Barrette, MD  traMADol (ULTRAM) 50 MG tablet 1-2 tablets every 6 hours as needed for pain 05/13/21  Yes Shalese Strahan, Lebron Conners, MD  amphetamine-dextroamphetamine (ADDERALL) 5 MG tablet Take 1 tablet (5 mg total) by mouth daily. 11/10/16   Georgina Quint, MD    Allergies    Patient has no known allergies.  Review of Systems   Review of Systems 10 systems reviewed and negative except as per HPI Physical Exam Updated Vital Signs BP 108/87  (BP Location: Right Arm)   Pulse 68   Temp 98.7 F (37.1 C) (Oral)   Resp 12   Ht 6' (1.829 m)   Wt 74.8 kg   SpO2 100%   BMI 22.38 kg/m   Physical Exam Constitutional:      Appearance: Normal appearance.  HENT:     Head: Normocephalic and atraumatic.     Mouth/Throat:     Mouth: Mucous membranes are moist.     Pharynx: Oropharynx is clear.  Eyes:     Extraocular Movements: Extraocular movements intact.     Conjunctiva/sclera: Conjunctivae normal.  Cardiovascular:     Rate and Rhythm: Normal rate and regular rhythm.  Pulmonary:     Effort: Pulmonary effort is normal.     Breath sounds: Normal breath sounds.  Chest:     Chest wall: No tenderness.  Abdominal:     General: There is no distension.     Palpations: Abdomen is soft.     Tenderness: There is no abdominal tenderness. There is no guarding.  Musculoskeletal:        General: No swelling or tenderness. Normal range of motion.     Right lower leg: No edema.     Left lower leg: No edema.  Skin:    General: Skin is warm and  dry.  Neurological:     General: No focal deficit present.     Mental Status: He is alert and oriented to person, place, and time.     Coordination: Coordination normal.    ED Results / Procedures / Treatments   Labs (all labs ordered are listed, but only abnormal results are displayed) Labs Reviewed  RESP PANEL BY RT-PCR (FLU A&B, COVID) [id number]  BASIC METABOLIC PANEL  CBC  D-DIMER, QUANTITATIVE  TROPONIN I (HIGH SENSITIVITY)  TROPONIN I (HIGH SENSITIVITY)    EKG EKG Interpretation  Date/Time:  Wednesday May 13 2021 14:41:11 EDT Ventricular Rate:  83 PR Interval:  110 QRS Duration: 100 QT Interval:  350 QTC Calculation: 411 R Axis:   81 Text Interpretation: Sinus rhythm with short PR Incomplete right bundle branch block Borderline ECG agree, no old comparison. Confirmed by Arby Barrette [phone]) on 05/13/2021 9:10:45 PM  Radiology DG Chest 2 View  Result Date:  05/13/2021 CLINICAL DATA:  Chest pain EXAM: CHEST - 2 VIEW COMPARISON:  None. FINDINGS: The heart size and mediastinal contours are within normal limits. Both lungs are clear. The visualized skeletal structures are unremarkable. IMPRESSION: No active cardiopulmonary disease. Electronically Signed   By: Jasmine Pang M.D.   On: 05/13/2021 15:37    Procedures Procedures   Medications Ordered in ED Medications  ketorolac (TORADOL) 30 MG/ML injection 30 mg (30 mg Intravenous Given 05/13/21 2142)    ED Course  I have reviewed the triage vital signs and the nursing notes.  Pertinent labs & imaging results that were available during my care of the patient were reviewed by me and considered in my medical decision making (see chart for details).    MDM Rules/Calculators/A&P                           Patient presents with sharp anterior chest pain.  Patient did not have preceding symptoms of cough or fever.  Chest x-ray is clear.  No signs at this time of pneumonia.  COVID-negative.  D-dimer negative patient does not have other PE risk factors.  Clinically well without past medical history or vital sign abnormality.  At this time differential includes pleurisy, chest wall pain, pericarditis.  Lower suspicion for pericarditis.  Patient does not have typical EKG changes and does not as amount of anticipated positional pain.  No murmurs or rub present on exam.  At this time we will treat with anti-inflammatories.  Patient is given a dose of Toradol.  He is much more comfortable and mobile at this time.  He still has some discomfort with certain position changes but can lie down and sit up and ambulate without difficulty.  Recommend follow-up with a PCP for recheck.  Strict return precautions reviewed Final Clinical Impression(s) / ED Diagnoses Final diagnoses:  Pleurisy    Rx / DC Orders ED Discharge Orders          Ordered    naproxen (NAPROSYN) 500 MG tablet  2 times daily        05/13/21 2350     traMADol (ULTRAM) 50 MG tablet        05/13/21 2350             Arby Barrette, MD 05/13/21 2359

## 2021-05-13 NOTE — ED Notes (Signed)
After blood draw, pt became pale and diaphoretic. Pt placed in a recliner with feet up. EDP to evaluate

## 2021-05-14 ENCOUNTER — Emergency Department (HOSPITAL_BASED_OUTPATIENT_CLINIC_OR_DEPARTMENT_OTHER): Admission: EM | Admit: 2021-05-14 | Discharge: 2021-05-14 | Discharge status: Absent without leave | Payer: Self-pay

## 2022-09-01 IMAGING — DX DG CHEST 2V
2 series · 2 of 2 positions shown · non-contrast
Comparison: None.

CLINICAL DATA: Chest pain

EXAM:
CHEST - 2 VIEW

[chest pa]
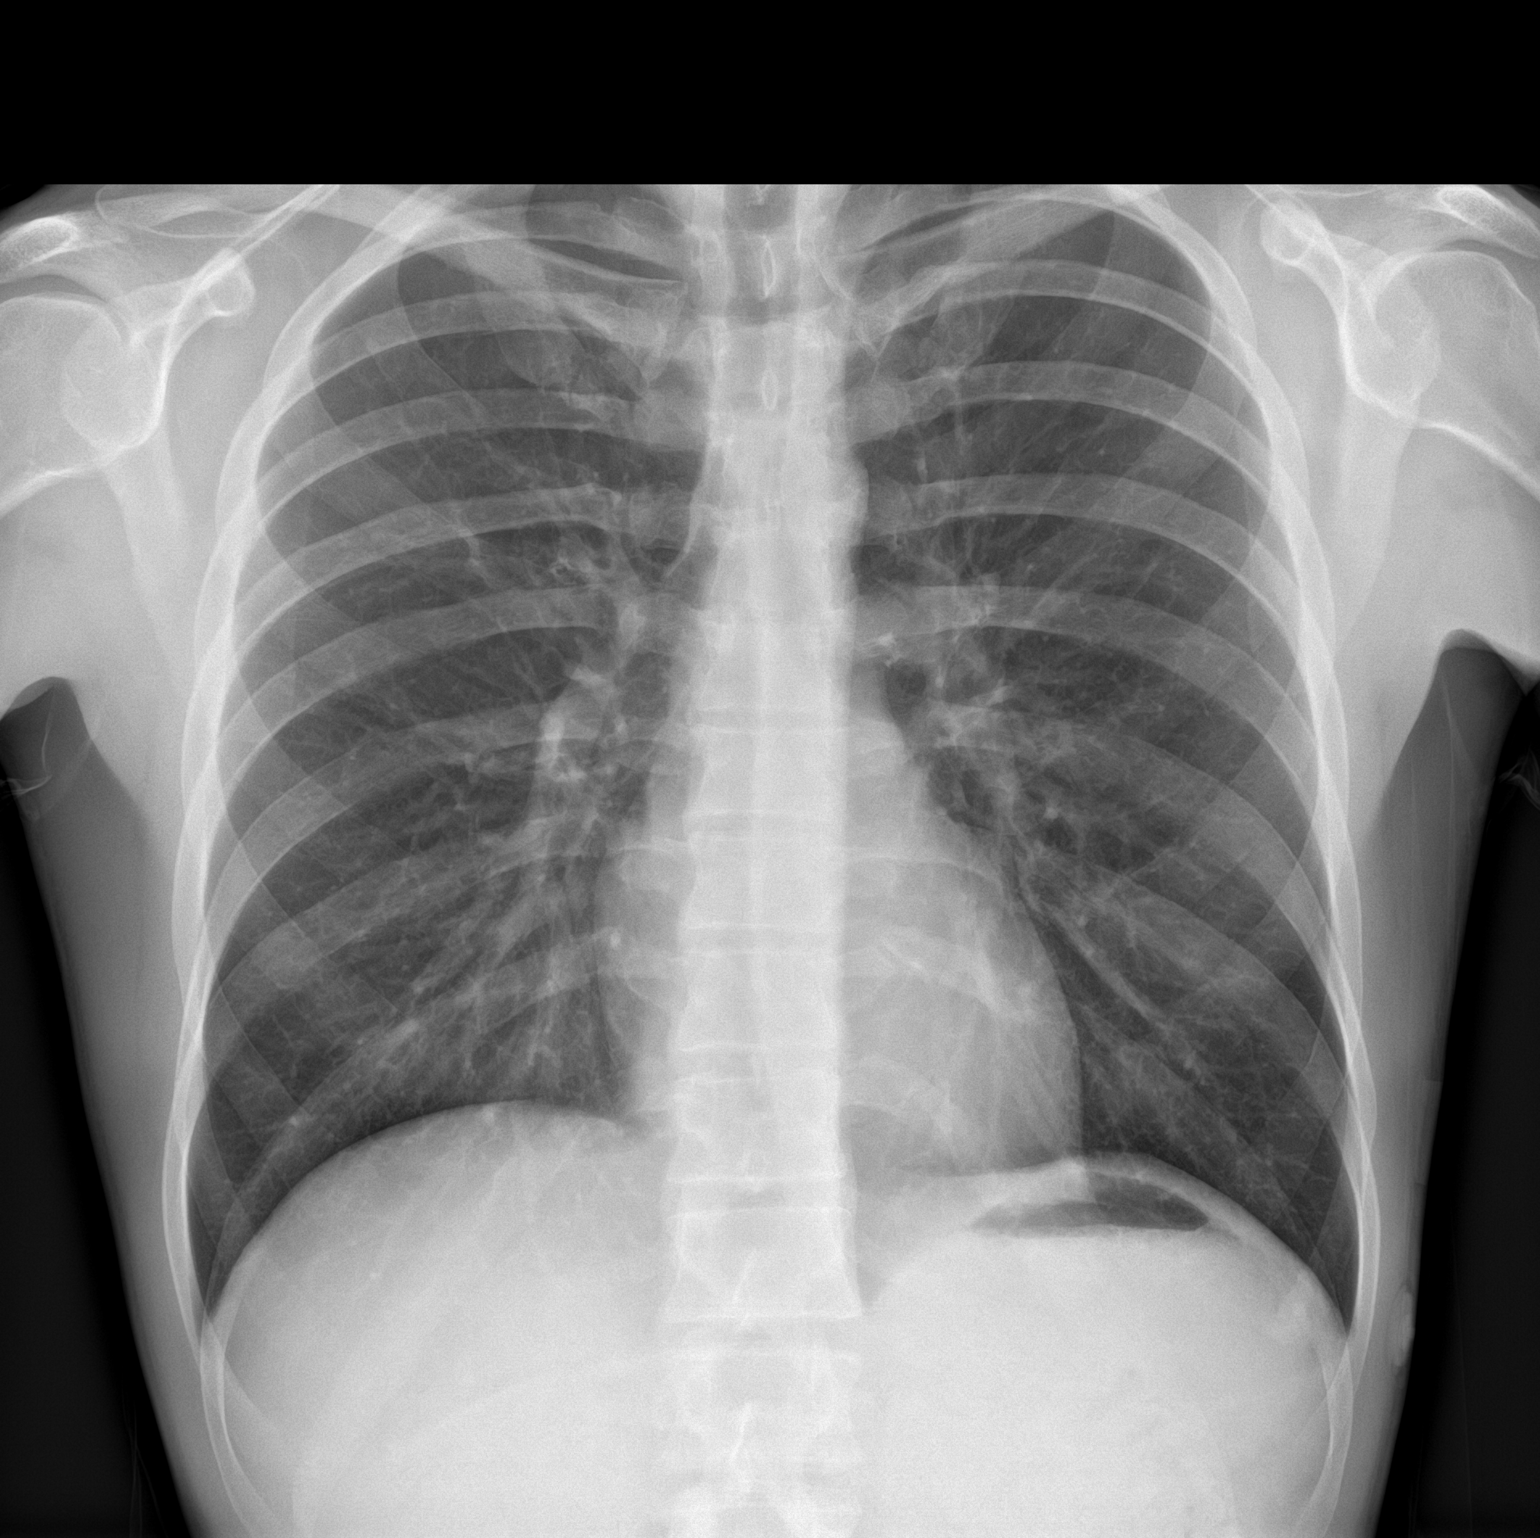

[chest lat]
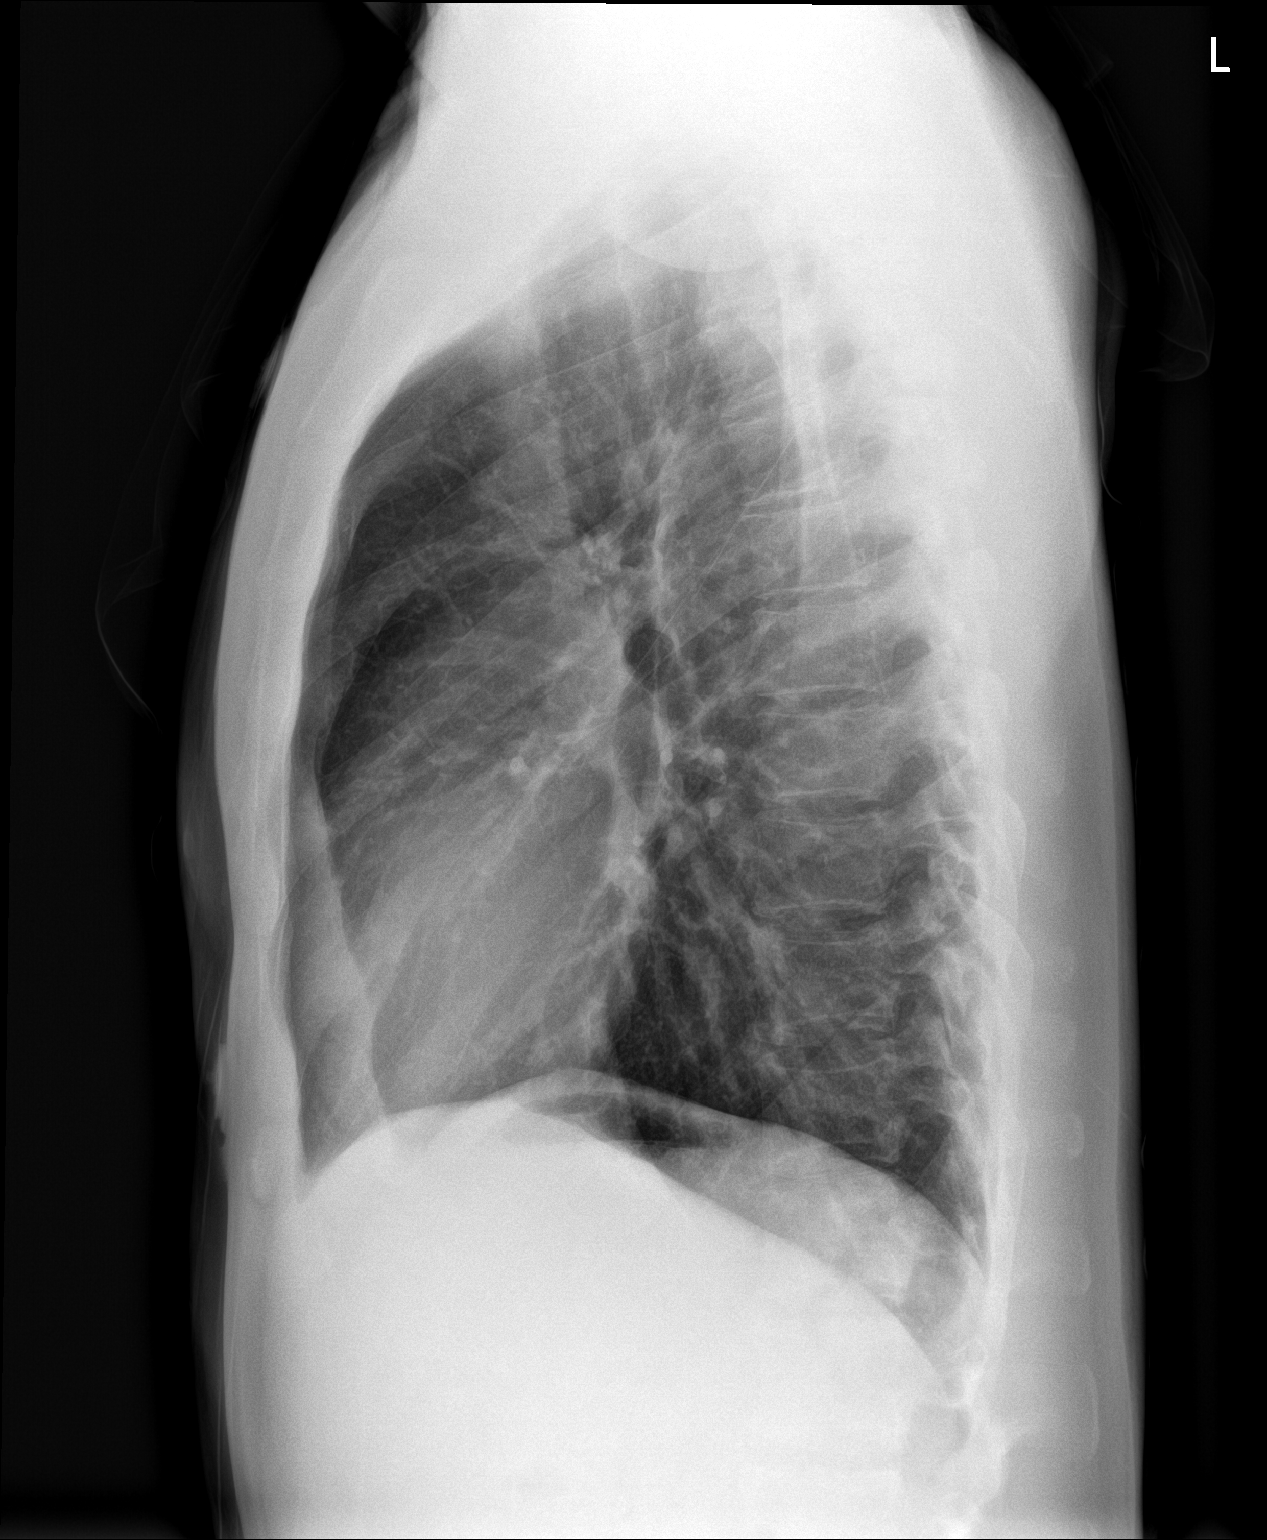

[2 of 2 positions shown; findings below may reference images not displayed]

FINDINGS: The heart size and mediastinal contours are within normal limits.
Both lungs are clear. The visualized skeletal structures are
unremarkable.
IMPRESSION: No active cardiopulmonary disease.

## 2024-11-08 ENCOUNTER — Ambulatory Visit (HOSPITAL_BASED_OUTPATIENT_CLINIC_OR_DEPARTMENT_OTHER): Payer: Self-pay | Admitting: Family Medicine

## 2024-12-17 ENCOUNTER — Ambulatory Visit (HOSPITAL_BASED_OUTPATIENT_CLINIC_OR_DEPARTMENT_OTHER): Admitting: Family Medicine
